# Patient Record
Sex: Male | Born: 1975 | Race: White | Hispanic: No | Marital: Single
Health system: Midwestern US, Community
[De-identification: ages and names within clinical notes are randomized; demographics above are authoritative.]

## PROBLEM LIST (undated history)

## (undated) DIAGNOSIS — M25531 Pain in right wrist: Secondary | ICD-10-CM

## (undated) DIAGNOSIS — S52501A Unspecified fracture of the lower end of right radius, initial encounter for closed fracture: Secondary | ICD-10-CM

---

## 2015-08-19 ENCOUNTER — Encounter: Attending: Internal Medicine | Primary: Registered Nurse

## 2015-08-24 ENCOUNTER — Encounter: Attending: Family | Primary: Registered Nurse

## 2015-11-18 ENCOUNTER — Emergency Department: Admit: 2015-11-18 | Payer: PRIVATE HEALTH INSURANCE | Primary: Registered Nurse

## 2015-11-18 ENCOUNTER — Inpatient Hospital Stay
Admit: 2015-11-18 | Discharge: 2015-11-18 | Disposition: A | Discharge status: Home / Self Care | Payer: PRIVATE HEALTH INSURANCE | Attending: Emergency Medicine

## 2015-11-18 DIAGNOSIS — S61214A Laceration without foreign body of right ring finger without damage to nail, initial encounter: Secondary | ICD-10-CM

## 2015-11-18 MED ORDER — TETANUS-DIPHTH-ACELL PERTUSSIS 5-2.5-18.5 LF-MCG/0.5 IM SUSP
5-2.5-18.5 LF-MCG/0.5 | Freq: Once | INTRAMUSCULAR | Status: AC
Start: 2015-11-18 — End: 2015-11-18
  Administered 2015-11-18: 19:00:00 0.5 mL via INTRAMUSCULAR

## 2015-11-18 MED ORDER — LIDOCAINE HCL 1 % IJ SOLN
1 % | Freq: Once | INTRAMUSCULAR | Status: AC
Start: 2015-11-18 — End: 2015-11-18
  Administered 2015-11-18: 20:00:00 5 mL via INTRADERMAL

## 2015-11-18 MED ORDER — IBUPROFEN 800 MG PO TABS
800 MG | ORAL_TABLET | Freq: Four times a day (QID) | ORAL | 0 refills | Status: DC | PRN
Start: 2015-11-18 — End: 2016-11-07

## 2015-11-18 MED ORDER — CEPHALEXIN 500 MG PO CAPS
500 MG | ORAL_CAPSULE | Freq: Four times a day (QID) | ORAL | 0 refills | Status: AC
Start: 2015-11-18 — End: 2015-11-28

## 2015-11-18 MED FILL — LIDOCAINE HCL 1 % IJ SOLN: 1 % | INTRAMUSCULAR | Qty: 20

## 2015-11-18 MED FILL — BOOSTRIX 5-2.5-18.5 IM SUSP: INTRAMUSCULAR | Qty: 0.5
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
## 2015-11-18 NOTE — ED Notes (Signed)
Report given to Dresser, Therapist, sports.      Jodene Nam, RN  11/18/15 [phone]

## 2015-11-18 NOTE — Care Coordination-Inpatient (Signed)
10/20 pt states he has an appt scheduled with PCP through his insurance, he is unsure of the name but assures me he has f/u.
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
## 2015-11-18 NOTE — Discharge Instructions (Signed)
Keep dressing clean, dry, and intact for 24 hours. After that you may take off the dressing and shower, pat dry wound and redress with clean gauze as instructed.   Look at laceration daily, if you notice any redness around the wound, increased swelling, pus draining or are having fevers, chills, nausea, or vomiting return to the Emergency Department.   Take pain and antibiotic medication as directed.   Do not smoke tobacco as this slows down bone healing and will prolong overall healing.   Follow up with the hand clinic.

## 2016-11-06 ENCOUNTER — Emergency Department: Admit: 2016-11-07 | Payer: PRIVATE HEALTH INSURANCE | Primary: Registered Nurse

## 2016-11-06 DIAGNOSIS — S52591A Other fractures of lower end of right radius, initial encounter for closed fracture: Secondary | ICD-10-CM
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
## 2016-11-06 NOTE — ED Notes (Signed)
Xray at bedside.     Army Fossa, RN  11/06/16 2300

## 2016-11-06 NOTE — ED Notes (Addendum)
Army Fossa, RN  11/07/16 [phone]

## 2016-11-06 NOTE — ED Notes (Signed)
Bed: 24  Expected date:   Expected time:   Means of arrival:   Comments:  [facility] [address] RN  11/06/16 2241
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
## 2016-11-07 ENCOUNTER — Inpatient Hospital Stay
Admit: 2016-11-07 | Discharge: 2016-11-07 | Disposition: A | Discharge status: Home / Self Care | Payer: PRIVATE HEALTH INSURANCE | Attending: Emergency Medicine

## 2016-11-07 ENCOUNTER — Emergency Department: Admit: 2016-11-07 | Payer: PRIVATE HEALTH INSURANCE | Primary: Registered Nurse

## 2016-11-07 MED ORDER — ACETAMINOPHEN 325 MG PO TABS
325 MG | ORAL_TABLET | Freq: Four times a day (QID) | ORAL | 0 refills | Status: DC | PRN
Start: 2016-11-07 — End: 2016-11-19

## 2016-11-07 MED ORDER — HYDROCODONE-ACETAMINOPHEN 5-325 MG PO TABS
5-325 MG | ORAL_TABLET | Freq: Four times a day (QID) | ORAL | 0 refills | Status: AC | PRN
Start: 2016-11-07 — End: 2016-11-14

## 2016-11-07 MED ORDER — PROPOFOL 200 MG/20ML IV EMUL
200 MG/20ML | INTRAVENOUS | Status: DC
Start: 2016-11-07 — End: 2016-11-07
  Administered 2016-11-07: 05:00:00 20 mg via INTRAVENOUS

## 2016-11-07 MED ORDER — IBUPROFEN 800 MG PO TABS
800 MG | ORAL_TABLET | Freq: Three times a day (TID) | ORAL | 0 refills | Status: DC | PRN
Start: 2016-11-07 — End: 2016-11-19

## 2016-11-07 MED ORDER — MORPHINE SULFATE (PF) 4 MG/ML IV SOLN
4 MG/ML | Freq: Once | INTRAVENOUS | Status: DC
Start: 2016-11-07 — End: 2016-11-07

## 2016-11-07 MED ORDER — MORPHINE SULFATE (PF) 4 MG/ML IV SOLN
4 MG/ML | Freq: Once | INTRAVENOUS | Status: AC
Start: 2016-11-07 — End: 2016-11-07
  Administered 2016-11-07: 05:00:00 4 mg via INTRAVENOUS

## 2016-11-07 MED ORDER — MIDAZOLAM HCL 2 MG/2ML IJ SOLN
2 MG/ML | Freq: Once | INTRAMUSCULAR | Status: AC
Start: 2016-11-07 — End: 2016-11-07
  Administered 2016-11-07: 05:00:00 1 mg via INTRAVENOUS

## 2016-11-07 MED ORDER — LIDOCAINE HCL (PF) 1 % IJ SOLN
1 % | Freq: Once | INTRAMUSCULAR | Status: AC
Start: 2016-11-07 — End: 2016-11-07
  Administered 2016-11-07: 04:00:00 20 mL via INTRADERMAL

## 2016-11-07 MED ORDER — PROPOFOL 200 MG/20ML IV EMUL
200 MG/20ML | INTRAVENOUS | Status: DC
Start: 2016-11-07 — End: 2016-11-07
  Administered 2016-11-07: 06:00:00 400 mg via INTRAVENOUS

## 2016-11-07 MED ORDER — ACETAMINOPHEN 500 MG PO TABS
500 MG | Freq: Once | ORAL | Status: AC
Start: 2016-11-07 — End: 2016-11-07
  Administered 2016-11-07: 06:00:00 1000 mg via ORAL

## 2016-11-07 MED ORDER — MORPHINE SULFATE (PF) 4 MG/ML IV SOLN
4 MG/ML | Freq: Once | INTRAVENOUS | Status: AC
Start: 2016-11-07 — End: 2016-11-06
  Administered 2016-11-07: 03:00:00 4 mg via INTRAVENOUS

## 2016-11-07 MED ORDER — IBUPROFEN 800 MG PO TABS
800 MG | Freq: Once | ORAL | Status: AC
Start: 2016-11-07 — End: 2016-11-07
  Administered 2016-11-07: 06:00:00 800 mg via ORAL

## 2016-11-07 MED FILL — MORPHINE SULFATE (PF) 4 MG/ML IV SOLN: 4 mg/mL | INTRAVENOUS | Qty: 1 | Fill #0

## 2016-11-07 NOTE — ED Notes (Signed)
Pt up and walking around in room. Pt sts he feels good and ready to go home.     Army Fossa, RN  11/07/16 [phone]

## 2016-11-07 NOTE — ED Notes (Signed)
Xray at bedside.     Army Fossa, RN  11/07/16 [phone]

## 2016-11-07 NOTE — ED Notes (Signed)
Xray at bedside again,.     Army Fossa, RN  11/07/16 [phone]

## 2016-11-07 NOTE — ED Notes (Signed)
Ortho resident at bedside.     Army Fossa, RN  11/07/16 [phone]

## 2016-11-07 NOTE — ED Notes (Signed)
Ortho placed pt in chinese finger trap to help with reduction. Pt tolerating well. Pt on full monitor.     Army Fossa, RN  11/07/16 [phone]
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
## 2016-11-07 NOTE — ED Notes (Signed)
Xray at bedside.     Army Fossa, RN  11/07/16 [phone]

## 2016-11-07 NOTE — ED Notes (Signed)
Ortho splinting right arm.     Army Fossa, RN  11/07/16 [phone]

## 2016-11-07 NOTE — ED Notes (Signed)
SW placed a call to provider to assist with transportation home upon D/C from ED        Denyse Dago, LSW  11/07/16 [phone]

## 2016-11-07 NOTE — ED Notes (Signed)
Pt c/o pain to right arm 9 out of 10. Dr. Alfonse Spruce made aware.     Army Fossa, RN  11/07/16 [phone]

## 2016-11-07 NOTE — ED Notes (Signed)
Pt axox4. Pt talking and eyes open. Significant other at bedside. Call light within reach.     Army Fossa, RN  11/07/16 [phone]

## 2016-11-07 NOTE — ED Notes (Signed)
Pt using urinal.     Army Fossa, RN  11/07/16 [phone]

## 2016-11-07 NOTE — Discharge Instructions (Addendum)
Orthopedic Instructions:  Weight bearing status: Non weight bearing for the right arm  Maintain splint at all times. Do not remove. Do NOT get wet. If splint falls off or becomes wet or soiled do not attempt to re-apply yourself. Come to the ED for new splint.  Always look for signs of compartment syndrome: pain out of proportion to the injury, pain not controlled with pain medication, numbness in digits, changing of color of digits (paleness). If these signs occur return to ED immediately for reassessment.   Ice (20 minutes on and off 1 hour) and elevate above the level of the heart to reduce swelling and throbbing pain.  Drink plenty of fluids.  Call the office or come to Emergency Room if signs of infection appear (hot, swollen, red, draining pus, fever)  Take medications as prescribed.  Wean off narcotics (percocet/norco) as soon as possible. Do not take tylenol if still taking narcotics.  Follow up with Dr. Posey Pronto in his office 7-10 days after injury. [phone] to schedule/confirm.

## 2016-11-10 ENCOUNTER — Inpatient Hospital Stay
Admit: 2016-11-10 | Discharge: 2016-11-10 | Disposition: A | Discharge status: Home / Self Care | Payer: PRIVATE HEALTH INSURANCE | Attending: Emergency Medicine

## 2016-11-10 DIAGNOSIS — M79601 Pain in right arm: Secondary | ICD-10-CM

## 2016-11-10 MED ORDER — OXYCODONE HCL 5 MG PO TABS
5 MG | ORAL_TABLET | Freq: Four times a day (QID) | ORAL | 0 refills | Status: AC | PRN
Start: 2016-11-10 — End: 2016-11-15

## 2016-11-10 MED ORDER — OXYCODONE HCL 5 MG PO TABS
5 MG | Freq: Once | ORAL | Status: AC
Start: 2016-11-10 — End: 2016-11-10
  Administered 2016-11-10: 20:00:00 10 mg via ORAL
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
## 2016-11-10 NOTE — ED Provider Notes (Signed)
Emergency Medicine Attending Note    I have seen and examined the patient in conjunction with the Resident/MLP.  Please see my key portion documented:      I agree with the assessment and plan as discussed with Dr. Tonye Royalty.    Electronically signed:  Roxanne Gates, M.D.           Burman Riis, MD  11/10/16 1622

## 2016-11-10 NOTE — Discharge Instructions (Signed)
Geronimo!!!    From [facility] Emergency Department    On behalf of the Emergency Department staff at [facility], I would like to thank you for giving [facility]. Evette Doffing the opportunity to address your health care needs and concerns.    We hope that during your visit, our service was delivered in a professional and caring manner. Please keep [facility]. Evette Doffing in mind as we walk with you down the path to your own personal wellness.     Please follow up with orthopedic surgery as planned.  Return to the emergency department for any worsening pain, numbness/tingling, pain with movement of her fingers, worsening swelling, or for any other cares or concerns.

## 2016-11-10 NOTE — ED Notes (Signed)
Joe Riis, Joe Campbell at bedside to evaluate pt     Merrily Pew, RN  11/10/16 [phone]
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
## 2016-11-13 ENCOUNTER — Encounter

## 2016-11-14 ENCOUNTER — Ambulatory Visit
Admit: 2016-11-14 | Discharge: 2016-11-14 | Payer: PRIVATE HEALTH INSURANCE | Attending: Orthopaedic Surgery | Primary: Registered Nurse

## 2016-11-14 ENCOUNTER — Ambulatory Visit: Admit: 2016-11-14 | Discharge: 2016-11-14 | Payer: PRIVATE HEALTH INSURANCE | Primary: Registered Nurse

## 2016-11-14 DIAGNOSIS — S52501A Unspecified fracture of the lower end of right radius, initial encounter for closed fracture: Secondary | ICD-10-CM

## 2016-11-14 DIAGNOSIS — M25531 Pain in right wrist: Secondary | ICD-10-CM

## 2016-11-14 MED ORDER — OXYCODONE HCL 5 MG PO TABS
5 MG | ORAL_TABLET | Freq: Four times a day (QID) | ORAL | 0 refills | Status: DC | PRN
Start: 2016-11-14 — End: 2016-11-19
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
## 2016-11-15 ENCOUNTER — Inpatient Hospital Stay
Admit: 2016-11-15 | Discharge: 2016-11-15 | Disposition: A | Discharge status: Home / Self Care | Payer: PRIVATE HEALTH INSURANCE | Attending: Emergency Medicine

## 2016-11-15 ENCOUNTER — Encounter: Attending: Orthopaedic Surgery | Primary: Registered Nurse

## 2016-11-15 DIAGNOSIS — S52354D Nondisplaced comminuted fracture of shaft of radius, right arm, subsequent encounter for closed fracture with routine healing: Secondary | ICD-10-CM

## 2016-11-15 MED ORDER — HYDROCODONE-ACETAMINOPHEN 5-325 MG PO TABS
5-325 MG | ORAL_TABLET | Freq: Four times a day (QID) | ORAL | 0 refills | Status: DC | PRN
Start: 2016-11-15 — End: 2016-11-19

## 2016-11-15 MED ORDER — HYDROCODONE-ACETAMINOPHEN 5-325 MG PO TABS
5-325 MG | Freq: Once | ORAL | Status: AC
Start: 2016-11-15 — End: 2016-11-15
  Administered 2016-11-15: 20:00:00 2 via ORAL
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
## 2016-11-15 NOTE — ED Provider Notes (Signed)
Emergency Medicine Attending Note    I have seen and examined the patient in conjunction with the Resident/MLP.  Please see my key portion documented:      I agree with the assessment and plan as discussed with Dr. Belva Crome.    Electronically signed:  Roxanne Gates, M.D.           Burman Riis, MD  11/15/16 [phone]
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
## 2016-11-15 NOTE — Telephone Encounter (Signed)
Patient called stating he lost his prescription after he filled it. Please advise patient if this can be refilled.

## 2016-11-15 NOTE — Telephone Encounter (Signed)
Contacted Patient.

## 2016-11-15 NOTE — Discharge Instructions (Signed)
Return to the emergency department if you notice any new or worsening symptoms.    Please see your primary care physician as soon as possible for further evaluation and treatment.    [place]!!!    From [facility] Emergency Department    On behalf of the Emergency Department staff at [facility], I would like to thank you for giving [facility]. Evette Doffing the opportunity to address your health care needs and concerns.    We hope that during your visit, our service was delivered in a professional and caring manner. Please keep [facility]. Evette Doffing in mind as we walk with you down the path to your own personal wellness.     Please expect an automated phone call from [phone] so we can ask a few questions about your health and progress. Based on your answers, a clinician may call you back to offer help and instructions.

## 2016-11-19 ENCOUNTER — Inpatient Hospital Stay
Admit: 2016-11-19 | Discharge: 2016-11-19 | Disposition: A | Discharge status: Home / Self Care | Payer: PRIVATE HEALTH INSURANCE | Attending: Emergency Medicine

## 2016-11-19 DIAGNOSIS — S42301D Unspecified fracture of shaft of humerus, right arm, subsequent encounter for fracture with routine healing: Secondary | ICD-10-CM

## 2016-11-19 MED ORDER — HYDROCODONE-ACETAMINOPHEN 5-325 MG PO TABS
5-325 MG | Freq: Once | ORAL | Status: AC
Start: 2016-11-19 — End: 2016-11-19
  Administered 2016-11-19: 20:00:00 2 via ORAL

## 2016-11-19 MED ORDER — IBUPROFEN 600 MG PO TABS
600 MG | ORAL_TABLET | Freq: Four times a day (QID) | ORAL | 0 refills | Status: DC | PRN
Start: 2016-11-19 — End: 2021-04-07

## 2016-11-19 MED ORDER — ACETAMINOPHEN 500 MG PO CAPS
500 MG | ORAL_CAPSULE | ORAL | 0 refills | Status: AC
Start: 2016-11-19 — End: ?

## 2016-11-19 NOTE — ED Provider Notes (Signed)
[place] ED     Emergency Department     Faculty Attestation        I performed a history and physical examination of the patient and discussed management with the resident. I reviewed the resident's note and agree with the documented findings and plan of care. Any areas of disagreement are noted on the chart. I was personally present for the key portions of any procedures. I have documented in the chart those procedures where I was not present during the key portions. I have reviewed the emergency nurses triage note. I agree with the chief complaint, past medical history, past surgical history, allergies, medications, social and family history as documented unless otherwise noted below.  For Physician Assistant/ Nurse Practitioner cases/documentation I have personally evaluated this patient and have completed at least one if not all key elements of the E/M (history, physical exam, and MDM). Additional findings are as noted.    Vital Signs: BP: 119/84  Pulse: 124  Resp: 18  Temp: 97.6 F (36.4 C) SpO2: 96 %  PCP:  No primary care provider on file.    Pertinent Comments:         Critical Care  None      (Please note that portions of this note were completed with a voice recognition program. Efforts were made to edit the dictations but occasionally words are mis-transcribed. Whenever words are used in this note in any gender, they shall be construed as though they were used in the gender appropriate to the circumstances; and whenever words are used in this note in the singular or plural form, they shall be construed as though they were used in the form appropriate to the circumstances.)    Melina Fiddler, MD Wilber Oliphant  Attending Emergency Medicine Physician              Precious Reel, MD  11/19/16 [phone]

## 2016-11-19 NOTE — Discharge Instructions (Signed)
Return to the emergency department if you notice any new or worsening symptoms.    Please see your primary care physician as soon as possible for further evaluation and treatment.    [place]!!!    From [facility] Emergency Department    On behalf of the Emergency Department staff at [facility], I would like to thank you for giving [facility]. Evette Doffing the opportunity to address your health care needs and concerns.    We hope that during your visit, our service was delivered in a professional and caring manner. Please keep [facility]. Evette Doffing in mind as we walk with you down the path to your own personal wellness.     Please expect an automated phone call from [phone] so we can ask a few questions about your health and progress. Based on your answers, a clinician may call you back to offer help and instructions.

## 2016-11-19 NOTE — ED Triage Notes (Signed)
Patient c/o right arm pain. Patient states that he knows its broke as seen Dr Sabra Heck. Patient states that this weekend he was arrested spent the weekend in jail without meds. Patient now states that he cant get his pain under control.

## 2016-11-19 NOTE — ED Provider Notes (Signed)
[facility] ED  Emergency Department Encounter  EmergencyMedicineResident     Pt Name:Joe Campbell  MRN: [id number]  [place] 1975-10-15  Date of evaluation: 11/19/16  PCP:  No primary care provider on file.    CHIEF COMPLAINT       Chief Complaint   Patient presents with   . Arm Pain       HISTORY OF PRESENT ILLNESS  (Location/Symptom, Timing/Onset, Context/Setting, Quality, Duration, Modifying Factors, Severity.)      Joe Campbell is a 41 y.o. male ( has no past medical history on file.) who presents with right arm pain, Old fracture that occurred on 11/06/16, is being followed by Dr. Sabra Heck (ortho). I saw this patient 4 days ago when he presented for the same issue. Patient has had no new injury or issues other than localized pain. He has kept his wrist splint on the entire time. Patient has not felt any new breaks or pops. He is able to move his fingers and the swelling has improved since his last visit to the ED. Patient has normal sensation and cap refill distally.    I prescribed the patient 6 pills of Norco for outpatient pain control, but advised him to attempt initial pain control with ibuprofen and acetaminophen.  Patient states that he picked up the prescription after discharge and has taken all but one of the pills.    Patient denies chest pain, shortness of breath, abdominal pain, nausea, vomiting, fever, chills, headache, dizziness, vision changes.     PAST MEDICAL / SURGICAL / SOCIAL / FAMILY HISTORY      has no past medical history on file.  No med hx     has a past surgical history that includes Leg Surgery (Right, 1992).  No surgical history      Social History     Social History   . Marital status: Single     Spouse name: N/A   . Number of children: N/A   . Years of education: N/A     Occupational History   . Not on file.     Social History Main Topics   . Smoking status: Current Every Day Smoker     Packs/day: 1.00     Types: Cigarettes   . Smokeless tobacco: Never Used   .  Alcohol use Yes   . Drug use: Yes     Types: Cocaine   . Sexual activity: Yes     Partners: Female     Other Topics Concern   . Not on file     Social History Narrative   . No narrative on file       History reviewed. No pertinent family history.      Allergies:  Patient has no known allergies.    Home Medications:  Prior to Admission medications    Medication Sig Start Date End Date Taking? Authorizing Provider   acetaminophen (TYLENOL) 325 MG tablet Take 2 tablets by mouth every 6 hours as needed for Pain 11/07/16  Yes Nat Math, MD       REVIEW OF SYSTEMS    (2-9 systems for level 4, 10 or more for level 5)      Review of Systems   Constitutional: Negative for activity change, chills, fatigue and fever.   HENT: Negative for congestion, ear pain, rhinorrhea and sinus pain.    Eyes: Negative for pain and redness.   Respiratory: Negative for apnea, cough, shortness of breath and wheezing.  Cardiovascular: Negative for chest pain.   Gastrointestinal: Negative for abdominal pain, blood in stool, diarrhea, nausea and vomiting.   Genitourinary: Negative for decreased urine volume, difficulty urinating, dysuria and hematuria.   Musculoskeletal: Negative for neck pain and neck stiffness.        Right wrist pain   Skin: Negative for rash.   Neurological: Negative for dizziness, syncope, weakness and headaches.       PHYSICAL EXAM   (up to 7 for level 4, 8 or more forlevel 5)      ED TRIAGE VITALS  BP: 119/84,Temp: 97.6 F (36.4 C), Pulse: 124, Resp: 18, SpO2: 96 %    Vitals:    11/19/16 1500   BP: 119/84   Pulse: 124   Resp: 18   Temp: 97.6 F (36.4 C)   SpO2: 96%   Weight: 160 lb (72.6 kg)   Height: 5' 8"  (1.727 m)       Physical Exam   Constitutional: He is oriented to person, place, and time. He appears well-developed and well-nourished. No distress.   HENT:   Head: Normocephalic and atraumatic.   Right Ear: External ear normal.   Left Ear: External ear normal.   Nose: Nose normal.   Mouth/Throat: Oropharynx  is clear and moist. No oropharyngeal exudate.   Eyes: Pupils are equal, round, and reactive to light. Conjunctivae and EOM are normal. Right eye exhibits no discharge. Left eye exhibits no discharge. No scleral icterus.   Neck: Normal range of motion. No tracheal deviation present.   Cardiovascular: Normal rate, regular rhythm, normal heart sounds and intact distal pulses.  Exam reveals no gallop and no friction rub.    No murmur heard.  Pulmonary/Chest: Effort normal and breath sounds normal. No stridor. No respiratory distress. He has no wheezes. He has no rales.   Abdominal: Soft. Bowel sounds are normal. He exhibits no distension and no mass. There is no tenderness.   Musculoskeletal:   Right wrist is in a splint, full range of motion of the elbow and fingers. Swelling has improved since his last visit. Full sensation and strength in the fingers.   Neurological: He is alert and oriented to person, place, and time. He exhibits normal muscle tone. Coordination normal.   Skin: Skin is warm and dry. No rash noted. He is not diaphoretic.         DIFFERENTIAL  DIAGNOSIS     PLAN (LABS / IMAGING / EKG):  No orders of the defined types were placed in this encounter.      MEDICATIONS ORDERED:  ED Medication Orders     Start Ordered     Status Ordering Provider    11/19/16 1615 11/19/16 1605  HYDROcodone-acetaminophen (NORCO) 5-325 MG per tablet 2 tablet  ONCE      Last MAR action:  Given - by Shanon Ace II on 11/19/16 at [place], [place]          DDX: Fracture, healing    DIAGNOSTIC RESULTS / EMERGENCY DEPARTMENT COURSE / MDM     IMPRESSION:  41 year old male with no relevant past medical history presents to the emergency department with pain in the right hand.  He has an old fracture that is still healing.  Initial injury was on 11/06/16.  Prescription of oxycodone given by Dr. Ammie Ferrier clinic with stool and/he lost it.  Patient did fill my prescription from 11/15/16, and is out of the [place].  Patient has been  wearing his splint as directed. He  has improvement in the swelling of his fingers. Full sensation, strength, and ROM of the fingers and elbow. Denies any new trauma. Plan is for pain control here in the ED only. No prescription for narcotics.    LABS:  No results found for this visit on 11/19/16.    RADIOLOGY:  No orders to display       EKG  None     All EKG's are interpreted by the Emergency Department Physician who either signsor Co-signs this chart in the absence of a cardiologist.    BEDSIDE ULTRASOUND:  None     EMERGENCY DEPARTMENT COURSE/MDM:  Patient was given 2 Norco here in the ED. He was told to follow up with Dr. Sabra Heck for further pain control. He was given appropriate ED return precautions and was also told to follow up with a PCP (clinic list provided). Patient was discharged in stable condition.     PROCEDURES:  None     CONSULTS:  None    CRITICAL CARE:  None     FINAL IMPRESSION      1. Right arm pain          DISPOSITION / PLAN     DISPOSITION    Discharge to home     PATIENT REFERRED TO:  [facility] ED  [place] [address]  [phone]  Go to   If symptoms worsen    Lowella Bandy, DO  [address]  [address] [place] [address]  [phone]      For follow-up of this visit      DISCHARGE MEDICATIONS:  New Prescriptions    ACETAMINOPHEN (ACETAMINOPHEN EXTRA STRENGTH) 500 MG CAPS    Take 1-2 tablets by mouth every 6 hours as needed for pain. Do not take more than 8 pills in a 24 hour period.    IBUPROFEN (ADVIL;MOTRIN) 600 MG TABLET    Take 1 tablet by mouth every 6 hours as needed for Pain       [name], MD  Emergency Medicine Resident    (Please note that portions of this note were completed witha voice recognition program.  Efforts were made to edit the dictations butoccasionally words are mis-transcribed.)       [name], MD  Resident  11/19/16 [phone]

## 2016-11-22 ENCOUNTER — Inpatient Hospital Stay: Admit: 2016-11-22 | Payer: PRIVATE HEALTH INSURANCE | Primary: Registered Nurse

## 2016-11-22 DIAGNOSIS — S52501A Unspecified fracture of the lower end of right radius, initial encounter for closed fracture: Secondary | ICD-10-CM

## 2016-11-22 NOTE — Telephone Encounter (Signed)
Patient came in office today for a refill on his pain medication. I asked Dr.Jacobsen who saw patient last Wednesday and he said that patient should be more comfortable being 2 weeks out from injury and in a brace. Dr.Jacbosen also checked with Dr.Patel. Dr.Patel said that since patient will be needing surgery he would like patient to alternate between ibuprofen and tylenol so that after surgery his pain can be better controlled.     Patient has a CT scan on 11/22/2016 to see what surgery he will have.    Called patient twice and his phone is not accepting any incoming calls at this time.

## 2016-11-28 NOTE — Telephone Encounter (Signed)
Tabor came in today asking about scheduling surgery following CT that was done on 11/22/16. He has an appt in clinic on 12/12/16 to go over results. He asked about pain meds for the interim.

## 2016-11-30 NOTE — Telephone Encounter (Signed)
I have been trying to reach this patient since Wednesday. There is not a good phone number to reach this patient. He is scheduled for surgery today. Please get a phone number if he calls back.

## 2016-11-30 NOTE — Telephone Encounter (Signed)
error 

## 2016-12-12 ENCOUNTER — Encounter: Primary: Registered Nurse

## 2016-12-12 NOTE — Telephone Encounter (Signed)
Lambros came in today asking about rescheduling surgery that he missed. He also asked about a refill on medication. # in chart is patients mother.

## 2016-12-13 NOTE — Telephone Encounter (Signed)
I thought I told you's morning that he is not to have surgery but to follow-up in the orthopedic clinic week after next.

## 2016-12-13 NOTE — Telephone Encounter (Signed)
Dr. Posey Pronto please advise as to course of action for this patient.

## 2016-12-18 NOTE — Telephone Encounter (Signed)
Left message for patient to call office.

## 2016-12-24 ENCOUNTER — Encounter

## 2016-12-26 ENCOUNTER — Encounter: Primary: Registered Nurse

## 2017-01-10 NOTE — Telephone Encounter (Signed)
Patient missed his appointment due to incarceration. Appointment scheduled January 2 at 2:40pm. Patient verbalizes understanding and is agreeable to appointment date and time.

## 2017-01-30 ENCOUNTER — Ambulatory Visit: Admit: 2017-01-30 | Discharge: 2017-01-30 | Primary: Registered Nurse

## 2017-01-30 ENCOUNTER — Ambulatory Visit
Admit: 2017-01-30 | Discharge: 2017-01-30 | Attending: Student in an Organized Health Care Education/Training Program | Primary: Registered Nurse

## 2017-01-30 DIAGNOSIS — S52501P Unspecified fracture of the lower end of right radius, subsequent encounter for closed fracture with malunion: Secondary | ICD-10-CM

## 2017-01-30 DIAGNOSIS — S52501A Unspecified fracture of the lower end of right radius, initial encounter for closed fracture: Secondary | ICD-10-CM

## 2017-01-30 MED ORDER — TRAMADOL HCL 50 MG PO TABS
50 MG | ORAL_TABLET | Freq: Three times a day (TID) | ORAL | 0 refills | Status: AC | PRN
Start: 2017-01-30 — End: 2017-02-06

## 2017-01-30 NOTE — Progress Notes (Signed)
[address]  [place] [address]  Dept: [phone]  Dept Fax: [phone]        Ambulatory Follow Up    Subjective:   Joe Campbell is a 41 y.o. year old male who presents to our office today for routine followup regarding his   1. 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 Post-Op Check     recheck wrist rt fx      HPI  Patient is a 42 year old male presents to the office follow-up after sustaining a intra-articular distal radius fracture back in early October. Patient was initially scheduled for surgery was unable to make his follow-up appointment and is procedure due to being in and out of jail. Patient states that his right wrist is painful, he has not been able to use it very much given the pain and stiffness. He states he occasionally wears the brace provided to them. He does state however that he was not able with brace on gel as it had metal in it. He states his been taking over-the-counter anti-inflammatories and Tylenol for pain relief but is not working. Patient states he is left-hand dominant.    Review of Systems    I have reviewed the CC, HPI, ROS, PMH, FHX, Social History.   I agree with the documentation provided by other staff, residents, and/or medical students and have reviewed their documentation prior to providing my signature indicating agreement.    Objective :   General: Joe Campbell is a 42 y.o. male who is alert and oriented and sitting comfortably in our office.  Ortho Exam  RUE: Positive ulnar variance appreciated. The radius is also noted to be in radial deviation as well as dorsally inhalation. Patient has limited range of motion to the wrist and fingers given pain. There is a 5 flexion contracture to the elbow. He is able to pronate proximal by 20, he lacks 20 of supination. Flexion of the fingers notes 2.5 cm distance from the  small finger to the palm, 3.5 cm distance from the ring finger to the palm, 3.75 cm distance from the middle finger to the palm and 4 cm distance from the index finger to the palm. Sensation intact grossly to the hand. Fingers warm and well-perfused.  Neuro: alert. oriented  Eyes: Extra-ocular muscles intact  Mouth: Oral mucosa moist. No perioral lesions  Pulm: Respirations unlabored and regular.  Skin: warm, well perfused  Psych:   Patient has good fund of knowledge and displays understanging of exam, diagnosis, and plan.    Radiology:   History:   Right intra-articular distal radius fracture     Comparison:   Radiograph some October 2018 for comparison    Findings:   3 views of the right wrist demonstrate distal radius intra-articular fracture with significant shortening dorsal angulation and ulnar positive variance    Impression:  Intra-articular distal radius fracture with    Assessment:      1. 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 Patient has a significantly deformed and malunited distal radius fracture. We discussed at this time that we will allow the fracture site to continue to consolidate. We encouraged the patient to begin physical therapy for range of motion to his fingers, wrist, and elbow. We  discussed that we will not perform corrective osteotomy surgery. Patient has full range of motion of his fingers.    2. Patient will perform occupational therapy here at [facility] and a prescription was provided for him. Patient was also provided with a prescription for tramadol 50 mg 3 times a day.    3. Patient will follow up in the office in 3 weeks for reevaluation. He is encouraged to call or return to the office sooner if questions or concerns. All questions were answered to his satisfaction, he is aware, stance, and voices his willingness to proceed as discussed.    Follow UX:LKGMWN in about 3 weeks (around 02/20/2017).    Orders Placed This  Encounter   Medications   . traMADol (ULTRAM) 50 MG tablet     Sig: Take 1 tablet by mouth every 8 hours as needed for Pain for up to 7 days..     Dispense:  21 tablet     Refill:  0       Orders Placed This Encounter   Procedures   . [place]     Referral Priority:   Routine     Referral Type:   Eval and Treat     Referral Reason:   Specialty Services Required     Requested Specialty:   Occupational Therapy     Number of Visits Requested:   1       Electronically signed by Lester Carolina, DO   Orthopedic Surgery Resident PGY-2  [facility]  [place], [place]  01/30/2017 at 3:53 PM

## 2017-04-16 NOTE — Significant Event (Signed)
[  x] [place].       Occupational Therapy       [address]       Phone: [phone]       Fax: [phone] []  [facility] [place]. [place], [place]       Phone: [phone]       Fax: [phone]          Occupational Therapy Cancel/No Show note    Date: 04/16/2017  Patient: Joe Campbell  DOB: 06/13/1975  MRN: [id number]  Cancels/No Shows to date: 0/1    For today's appointment patient:  []   Cancelled  []   Rescheduled appointment  [x]   No-show     Reason given by patient:  []   Patient ill  []   Conflicting appointment  []   No transportation    []   Conflict with work  []   No reason given  []   Other:     Comments:  No show for scheduled evaluation.    Electronically signed by: Dorrene German, OTR/L, CHT

## 2017-04-17 ENCOUNTER — Inpatient Hospital Stay: Primary: Registered Nurse

## 2021-02-22 ENCOUNTER — Encounter: Attending: Registered Nurse

## 2021-02-24 ENCOUNTER — Inpatient Hospital Stay
Admit: 2021-02-24 | Discharge: 2021-02-24 | Disposition: A | Discharge status: Home / Self Care | Payer: MEDICAID | Attending: Emergency Medicine

## 2021-02-24 ENCOUNTER — Emergency Department: Admit: 2021-02-24 | Payer: MEDICAID

## 2021-02-24 DIAGNOSIS — R000 Tachycardia, unspecified: Secondary | ICD-10-CM

## 2021-02-24 LAB — TROPONIN: Troponin, High Sensitivity: <6 ng/L (ref 0–22)

## 2021-02-24 LAB — CBC
Hematocrit: 44.2 % (ref 40.7–50.3)
Hemoglobin: 14.9 g/dL (ref 13.0–17.0)
MCH: 30.3 pg (ref 25.2–33.5)
MCHC: 33.7 g/dL (ref 28.4–34.8)
MCV: 89.8 fL (ref 82.6–102.9)
MPV: 11.1 fL (ref 8.1–13.5)
NRBC Automated: 0 per 100 WBC
Platelets: 200 10*3/uL (ref 138–453)
RBC: 4.92 m/uL (ref 4.21–5.77)
RDW: 13.4 % (ref 11.8–14.4)
WBC: 9.1 10*3/uL (ref 3.5–11.3)

## 2021-02-24 LAB — COMPREHENSIVE METABOLIC PANEL
ALT: 10 U/L (ref 5–41)
AST: 18 U/L (ref <40)
Albumin/Globulin Ratio: 1.6 (ref 1.0–2.5)
Albumin: 4.2 g/dL (ref 3.5–5.2)
Alkaline Phosphatase: 92 U/L (ref 40–129)
Anion Gap: 8 mmol/L — ABNORMAL LOW (ref 9–17)
BUN: 9 mg/dL (ref 6–20)
CO2: 25 mmol/L (ref 20–31)
Calcium: 8.8 mg/dL (ref 8.6–10.4)
Chloride: 104 mmol/L (ref 98–107)
Creatinine: 0.9 mg/dL (ref 0.70–1.20)
Est, Glom Filt Rate: >60 mL/min/{1.73_m2} (ref >60)
Glucose: 105 mg/dL — ABNORMAL HIGH (ref 70–99)
Potassium: 4 mmol/L (ref 3.7–5.3)
Sodium: 137 mmol/L (ref 135–144)
Total Bilirubin: 0.3 mg/dL (ref 0.3–1.2)
Total Protein: 6.9 g/dL (ref 6.4–8.3)

## 2021-02-24 LAB — TSH WITH REFLEX: TSH: 0.53 u[IU]/mL (ref 0.30–5.00)

## 2021-02-24 LAB — COVID-19, RAPID: SARS-CoV-2, Rapid: NOT DETECTED

## 2021-02-24 LAB — MAGNESIUM: Magnesium: 2.1 mg/dL (ref 1.6–2.6)

## 2021-02-24 MED ORDER — ASPIRIN 81 MG PO CHEW
81 MG | Freq: Once | ORAL | Status: AC
Start: 2021-02-24 — End: 2021-02-24
  Administered 2021-02-24: 17:00:00 324 mg via ORAL

## 2021-02-24 MED ORDER — SODIUM CHLORIDE 0.9 % IV BOLUS
0.9 % | Freq: Once | INTRAVENOUS | Status: AC
Start: 2021-02-24 — End: 2021-02-24
  Administered 2021-02-24: 17:00:00 1000 mL via INTRAVENOUS

## 2021-02-24 MED FILL — ASPIRIN LOW STRENGTH 81 MG PO CHEW: 81 mg | ORAL | Qty: 4

## 2021-02-24 NOTE — ED Provider Notes (Signed)
[facility] ED  Emergency Department Encounter  Emergency Medicine Resident     Pt Name:Joe Campbell  MRN: [id number]  [place] 1975/02/12  Date of evaluation: 02/24/21  PCP:  No primary care provider on file.      CHIEF COMPLAINT       Chief Complaint   Patient presents with    Fever    Back Pain    Chest Pain       HISTORY OF PRESENT ILLNESS  (Location/Symptom, Timing/Onset, Context/Setting, Quality, Duration, Modifying Factors, Severity.)      Joe Campbell is a 46 y.o. male who presents with chest pain back pain and fever.  He was kicked out of the plasma center today as he was told that he had a fever.  He states he was kicked out of the plasma center 2 days ago as his heart rate was elevated.  He denies any illness but is suspicious may be he has COVID.  He has a history of smoking otherwise has no diagnosed past medical history and does not follow with a primary care physician.  He states he had progressive back pain has been going on now for 3 months getting worse.  He denies any numbness, tingling, weakness or loss of bowel or bladder control.  Denies any IV drug use, diabetes or dialysis.    PAST MEDICAL / SURGICAL / SOCIAL / FAMILY HISTORY      has no past medical history on file.       has a past surgical history that includes Leg Surgery (Right, 1992).      Social History     Socioeconomic History    Marital status: Single     Spouse name: Not on file    Number of children: Not on file    Years of education: Not on file    Highest education level: Not on file   Occupational History    Not on file   Tobacco Use    Smoking status: Every Day     Packs/day: 1.00     Types: Cigarettes    Smokeless tobacco: Never   Substance and Sexual Activity    Alcohol use: Yes    Drug use: Yes     Types: Cocaine    Sexual activity: Yes     Partners: Female   Other Topics Concern    Not on file   Social History Narrative    Not on file     Social Determinants of Health     Financial Resource Strain:  Not on file   Food Insecurity: Not on file   Transportation Needs: Not on file   Physical Activity: Not on file   Stress: Not on file   Social Connections: Not on file   Intimate Partner Violence: Not on file   Housing Stability: Not on file       No family history on file.    Allergies:  Patient has no known allergies.    Home Medications:  Prior to Admission medications    Medication Sig Start Date End Date Taking? Authorizing Provider   RA ACETAMINOPHEN EX ST 500 MG tablet take 1 to 2 tablets by mouth every 6 hours if needed for pain maximum daily dose of 8 12/11/16   Historical Provider, MD   Acetaminophen (ACETAMINOPHEN EXTRA STRENGTH) 500 MG CAPS Take 1-2 tablets by mouth every 6 hours as needed for pain. Do not take more than 8 pills in a 24 hour  period. 11/19/16   [name], MD   ibuprofen (ADVIL;MOTRIN) 600 MG tablet Take 1 tablet by mouth every 6 hours as needed for Pain 11/19/16   [name], MD         REVIEW OF SYSTEMS       Review of Systems   Constitutional:  Negative for chills.   Cardiovascular:  Positive for chest pain and palpitations.   Gastrointestinal:  Negative for abdominal pain, nausea and vomiting.   Musculoskeletal:  Negative for back pain.   Skin:  Negative for rash and wound.     PHYSICAL EXAM      INITIAL VITALS:   BP 115/86    Pulse 99    Temp 98.7 ??F (37.1 ??C)    Resp 12    SpO2 100%     Physical Exam  Constitutional:       General: He is not in acute distress.     Appearance: Normal appearance. He is not ill-appearing.   HENT:      Head: Normocephalic and atraumatic.      Nose: Nose normal.      Mouth/Throat:      Mouth: Mucous membranes are dry.   Cardiovascular:      Rate and Rhythm: Regular rhythm. Tachycardia present.   Pulmonary:      Effort: Pulmonary effort is normal. No respiratory distress.      Breath sounds: No wheezing or rales.   Abdominal:      General: There is no distension.   Musculoskeletal:         General: Normal range of motion.      Cervical back: Normal  range of motion.      Right lower leg: No edema.      Left lower leg: No edema.   Skin:     General: Skin is warm.      Capillary Refill: Capillary refill takes less than 2 seconds.   Neurological:      General: No focal deficit present.      Mental Status: He is alert and oriented to person, place, and time.         DDX/DIAGNOSTIC RESULTS / EMERGENCY DEPARTMENT COURSE / MDM     Medical Decision Making  46 year old male coming in today for evaluation of palpitation, chest pain, back pain.  He was asked to leave the day at the plasma center today as he had a fever.  He was afebrile here.  He was kicked out of the plasma center 2 days ago as his heart rate was too fast.  His heart rate is 110 here.  EKG nondiagnostic showing sinus tachycardia.  His chest pain complaint is one that feels "numbness" over top of the anterior chest wall.  He does have a history of smoking.  Otherwise no significant past medical history.  His exam was noted for clear breath sounds normal heart rate.  No leg swelling.  No acute distress on exam.  Chest x-ray negative.  ACS work-up negative.  We did add on electrolytes and TSH were both within normal limits.  Provided an aspirin for pain control.  He felt better by the end of the visit.  He will be discharged home after a liter of IV fluids and improvement in his heart rate to follow-up with his primary care physician.  I provided him with home stretching exercises and instructions to help manage back pain at home.    Amount and/or Complexity of Data Reviewed  Labs:  ordered. Decision-making details documented in ED Course.  Radiology: ordered and independent interpretation performed.     Details: No pneumonia or pneumothorax  ECG/medicine tests: ordered.    Risk  OTC drugs.  Prescription drug management.    Critical Care  Total time providing critical care: < 30 minutes        EMERGENCY DEPARTMENT COURSE:    ED Course as of 02/24/21 1547   Fri Feb 24, 2021   1126 Temp: 98.7 ??F (37.1 ??C) [TJ]    1126 Heart Rate(!): 110 [TJ]   1241 Troponin, High Sensitivity: <6 [TJ]   1241 SARS-CoV-2, Rapid: Not Detected [TJ]   1251 CXR negative [TJ]   1428 TSH: 0.53 [TJ]      ED Course User Index  [TJ] Weston Brass, MD       PROCEDURES:    Procedures    CONSULTS:  None        FINAL IMPRESSION      1. Tachycardia          DISPOSITION / PLAN     DISPOSITION Decision To Discharge 02/24/2021 02:28:33 PM      PATIENT REFERRED TO:  [place] [id number]  [place]:  Discharge Medication List as of 02/24/2021  2:30 PM          Weston Brass, MD  Emergency Medicine Resident    (Please note that portions of thisnote were completed with a voice recognition program.  Efforts were made to edit the dictations but occasionally words are mis-transcribed.)       Weston Brass, MD  Resident  02/24/21 [phone]

## 2021-02-24 NOTE — ED Notes (Signed)
Pt walks to the bathroom on steady gate     Sherry Ruffing, RN  02/24/21 1345

## 2021-02-24 NOTE — ED Notes (Signed)
Pt came c/o Back pain, and fever. Pt said he is about to donate blood, but was held cause he had a fever when they took his vitals and he Tachycardic too, thus was sent to ED. Pt conscious and coherent upon admission, afebrile as well. Had an on and off back pain for about a yaer.     Sherry Ruffing, RN  02/24/21 1125

## 2021-02-24 NOTE — ED Notes (Signed)
Pt back from [place], [place]  02/24/21 1237

## 2021-02-24 NOTE — ED Notes (Signed)
Pt to xray on [place], RN  02/24/21 [phone]

## 2021-02-24 NOTE — Discharge Instructions (Addendum)
Call today or tomorrow to follow up with your PCP.      Use an ice pack or bag filled with ice and apply to the injured area 3 - 4 times a day for 15 - 20 minutes each time.  If the injury is older than 3 days, then use a heating pad to help relax the muscles in your back.    Use ibuprofen or Tylenol (unless prescribed medications that have Tylenol in it) for pain.  You can take over the counter Ibuprofen (advil) tablets (4 tablets every 8 hours or 3 tablets every 6 hours or 2 tablets every 4 hours)    Williams' Flexion Exercises     1. Pelvic tilt. Lie on your back with knees bent, feet flat on floor. Flatten the small of your back against the floor, without pushing down with the legs. Hold for 5 to 10 seconds.     2. Single Knee to chest. Lie on your back with knees bent and feet flat on the floor. Slowly pull your right knee toward your shoulder and hold 5 to 10 seconds. Lower the knee and repeat with the other knee.     3. Double knee to chest. Begin as in the previous exercise. After pulling right knee to chest, pull left knee to chest and hold both knees for 5 to 10 seconds. Slowly lower one leg at a time.     4. Partial sit-up. Do the pelvic tilt (exercise 1) and, while holding this position, slowly curl your head and shoulders off the floor. Hold briefly. Return slowly to the starting position.     5. Hamstring stretch. Start in long sitting with toes directed toward the ceiling and knees fully extended. Slowly lower the trunk forward over the legs, keeping knees extended, arms outstretched over the legs, and eyes focus ahead.     6. Hip Flexor stretch. Place one foot in front of the other with the left (front) knee flexed and the right (back) knee held rigidly straight. Flex forward through the trunk until the left knee contacts the axillary fold (arm pit region). Repeat with right leg forward and left leg back.     7. Squat. Stand with both feet parallel, about shoulder's width apart. Attempting to  maintain the trunk as perpendicular as possible to the floor, eyes focused ahead, and feet flat on the floor, the subject slowly lowers his body by flexing his knees    Return to the Emergency Department for inability to move legs, worsening of pain, tingling / loss of sensation, any other care or concern.

## 2021-02-24 NOTE — ED Notes (Signed)
Pt back to room     Sherry Ruffing, RN  02/24/21 1401

## 2021-02-24 NOTE — ED Provider Notes (Signed)
[facility]     Emergency Department     Faculty Attestation    I performed a history and physical examination of the patient and discussed management with the resident. I reviewed the resident???s note and agree with the documented findings including all diagnostic interpretations and plan of care. Any areas of disagreement are noted on the chart. I was personally present for the key portions of any procedures. I have documented in the chart those procedures where I was not present during the key portions. I have reviewed the emergency nurses triage note. I agree with the chief complaint, past medical history, past surgical history, allergies, medications, social and family history as documented unless otherwise noted below. Documentation of the HPI, Physical Exam and Medical Decision Making performed by scribes is based on my personal performance of the HPI, PE and MDM. For Physician Assistant/ Nurse Practitioner cases/documentation I have personally evaluated this patient and have completed at least one if not all key elements of the E/M (history, physical exam, and MDM). Additional findings are as noted.    Primary Care Physician: No primary care provider on file.    VITAL SIGNS:   temperature is 98.7 ??F (37.1 ??C). His blood pressure is 115/86 and his pulse is 99. His respiration is 12 and oxygen saturation is 100%.      Medical Decision Making  Chest pain back pain fever and tachycardia.  Has had multiple episodes where he cannot donate plasma due to use of fever tachycardia.  Does report cough with this.  No vomiting.  No IV use other than plasma donation including IV drug use.  Denies any numbness weakness or urinary complaints.  On exam cardiac exam regular rate and rhythm no murmurs rubs gallops, pulmonary clear bilaterally abdomen soft nontender nondistended there is left paraspinal spasm on back exam no midline tenderness no overlying skin  changes.    Amount and/or Complexity of Data Reviewed  Labs: ordered. Decision-making details documented in ED Course.  Radiology: ordered.  ECG/medicine tests: ordered.    Risk  OTC drugs.  Prescription drug management.      EKG Interpretation    Interpreted by emergency department physician    Rhythm:  Borderline tachycardia  Rate: 100  Axis: normal  Ectopy: none  Conduction: normal  ST Segments: normal  T Waves: normal  Q Waves: none    EKG  Impression: Borderline tachycardia, otherwise normal EKG    Ree Shay, MD      Harley Hallmark, MD, Wilber Oliphant  Attending Emergency Physician        Ree Shay, MD  02/24/21 1315

## 2021-02-25 LAB — EKG 12-LEAD
Atrial Rate: 100 {beats}/min
P Axis: 66 degrees
P-R Interval: 146 ms
Q-T Interval: 326 ms
QRS Duration: 82 ms
QTc Calculation (Bazett): 420 ms
R Axis: 26 degrees
T Axis: 59 degrees
Ventricular Rate: 100 {beats}/min

## 2021-03-09 ENCOUNTER — Inpatient Hospital Stay: Payer: MEDICAID | Primary: Registered Nurse

## 2021-03-09 ENCOUNTER — Ambulatory Visit: Admit: 2021-03-09 | Discharge: 2021-03-09 | Payer: MEDICAID | Attending: Registered Nurse | Primary: Registered Nurse

## 2021-03-09 DIAGNOSIS — R7309 Other abnormal glucose: Secondary | ICD-10-CM

## 2021-03-09 DIAGNOSIS — Z7689 Persons encountering health services in other specified circumstances: Secondary | ICD-10-CM

## 2021-03-09 LAB — LIPID PANEL
Chol/HDL Ratio: 3.5 (ref <5)
Cholesterol: 150 mg/dL (ref <200)
HDL: 43 mg/dL (ref >40)
LDL Cholesterol: 92 mg/dL (ref 0–130)
Triglycerides: 76 mg/dL (ref <150)

## 2021-03-09 MED ORDER — METHYLPREDNISOLONE 4 MG PO TBPK
4 MG | PACK | ORAL | 0 refills | Status: AC
Start: 2021-03-09 — End: 2021-03-15

## 2021-03-09 MED ORDER — LIDOCAINE 5 % EX PTCH
5 % | MEDICATED_PATCH | Freq: Every day | CUTANEOUS | 0 refills | Status: AC
Start: 2021-03-09 — End: 2021-03-19

## 2021-03-09 MED ORDER — KETOCONAZOLE 2 % EX CREA
2 % | Freq: Two times a day (BID) | CUTANEOUS | 3 refills | Status: AC
Start: 2021-03-09 — End: ?

## 2021-03-09 MED ORDER — MELOXICAM 15 MG PO TABS
15 MG | ORAL_TABLET | Freq: Every day | ORAL | 3 refills | Status: AC
Start: 2021-03-09 — End: ?

## 2021-03-09 NOTE — Progress Notes (Signed)
Joe Campbell (DOB:  Jun 14, 1975) is a 46 y.o. male,New patient, here for evaluation of the following chief complaint(s):  New Patient (Has not been seen in about 10 years. /States that he donates plasma and his pulse is in the 120s doing nothing. Pt went to the ED and and EKG weas completed and it was normal. /), Health Maintenance (Depression screen completed. /), Back Pain (Pt states hurts to stand and sit for long period of time. States been on going since 2018. Pt denies having images of his back. ), and Rash (Left side "rings" he has had them for about a week. /)         ASSESSMENT/PLAN:  1. Encounter to establish care  Comments:  Rx for remainder of annual labs - follow up pending results.   2. Elevated glucose  -     Hemoglobin A1C; Future  3. Encounter for screening for HIV  -     HIV Screen; Future  4. Hyperlipidemia, unspecified hyperlipidemia type  -     Lipid Panel; Future  5. Chest pain, unspecified type  Comments:  Rx for2d echo, stress test. Red flag s/s of when to seek emergent care for chest pain reviewed. Verbalized understanding.  Orders:  -     ECHO 2D WO Color Doppler Complete; Future  -     CARDIAC STRESS TEST EXERCISE ONLY; Future  6. Tachycardia  Comments:  Advise smoking cessation. 2d echo, stress test ordered today. Heart rate WNL today. Holter monitor if persists.  7. Ringworm  -     ketoconazole (NIZORAL) 2 % cream; Apply 1 Applicatorful topically 2 times daily Apply topically BID for 2-4 weeks, Topical, 2 TIMES DAILY Starting Thu 03/09/2021, Disp-60 g, R-3, Normal  8. Encounter for hepatitis C virus screening test for high risk patient  -     Hepatitis C Antibody; Future  9. Nicotine abuse  Comments:  Not ready to quit smoking.  10. Chronic midline low back pain without sciatica  -     XR LUMBAR SPINE (2-3 VIEWS); Future  -     meloxicam (MOBIC) 15 MG tablet; Take 1 tablet by mouth daily, Disp-30 tablet, R-3Normal  -     methylPREDNISolone (MEDROL, PAK,) 4 MG tablet; Take as  directed, Disp-1 kit, R-0Normal  -     lidocaine (LIDODERM) 5 %; Place 1 patch onto the skin daily for 10 days 12 hours on, 12 hours off., Disp-10 patch, R-0Normal  NSAID/Tyelnol abuse - discussed appropriate use of medication  Rx for mobic - do not take with additional NSAIDS - verbalized understanding  Rx for medrol pak and lidocaine patch with instruction for use  Rx for XR lumbar spine, follow up pending results       There are other unrelated non-urgent complaints, but due to the busy schedule and the amount of time I've already spent with him, time does not permit me to address these routine issues at today's visit. I've requested another appointment to review these additional issues.      Return in about 4 weeks (around 04/06/2021) for 1 month follow up from establishing care .         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 Has trouble sitting for extended periods of time.   Manages pain with OTC tylenol/motrin. Admits he over uses tylenol/motrin, takes anywhere from 4-6 pills every 4 to 6 hours daily.     Derm: Has several ring like rashes to abdomen   They are not itchy, 'just keep showing up'     Reports hx of drug and alcohol abuse  States last time he smoked crack  was 2019  Admits to occasional marijuana use  No longer drinks alcohol       Back Pain  This is a chronic problem. The current episode started more than 1 year ago (2018). The problem occurs constantly. The problem has been gradually worsening since onset. The pain is present in the lumbar spine. The quality of the pain is described as aching and stabbing (stabbing when he stands or sits). The pain does not radiate. The pain is at a severity of 8/10. The pain is severe. The pain is The same all the time. The symptoms are aggravated by twisting, standing and sitting. Stiffness is present In the morning. Pertinent negatives include no abdominal pain, bladder incontinence, bowel incontinence, chest pain, dysuria, headaches, leg pain, numbness, paresthesias, tingling or weakness. Risk factors include poor posture and sedentary lifestyle. He has tried NSAIDs for the symptoms. The treatment provided no relief.     Review of Systems   Constitutional:  Negative for activity change, chills, fatigue and unexpected weight change.   HENT:  Negative for hearing loss, postnasal drip, sinus pressure and sinus pain.    Eyes:  Negative for pain and visual disturbance.   Respiratory:  Negative for chest tightness and shortness of breath.    Cardiovascular:  Positive for palpitations. Negative for chest pain.   Gastrointestinal:  Negative for abdominal pain, bowel incontinence, constipation, diarrhea, nausea and vomiting.   Endocrine: Negative for polydipsia, polyphagia and polyuria.   Genitourinary:  Negative for bladder incontinence, dysuria, flank pain, frequency and urgency.   Musculoskeletal:  Positive for back pain and gait problem. Negative for arthralgias and myalgias.   Skin:  Positive for rash. Negative for color change.   Neurological:  Negative for dizziness, tingling, weakness, light-headedness, numbness, headaches and paresthesias.   Psychiatric/Behavioral:  Negative for confusion, hallucinations, self-injury, sleep  disturbance and suicidal ideas. The patient is not nervous/anxious.         Objective   Physical Exam  Vitals and nursing note reviewed.   Constitutional:       Appearance: Normal appearance.   HENT:      Head: Normocephalic.      Right Ear: Hearing normal.      Left Ear: Hearing normal.      Nose: Nose normal.      Mouth/Throat:      Mouth: Mucous membranes are moist.      Pharynx: Oropharynx is clear.   Eyes:      Extraocular Movements: Extraocular  movements intact.      Conjunctiva/sclera: Conjunctivae normal.      Pupils: Pupils are equal, round, and reactive to light.   Cardiovascular:      Rate and Rhythm: Normal rate and regular rhythm.      Pulses: Normal pulses.      Heart sounds: Normal heart sounds.   Pulmonary:      Effort: Pulmonary effort is normal.      Breath sounds: Normal breath sounds.   Abdominal:      General: Abdomen is flat. Bowel sounds are normal.      Palpations: Abdomen is soft.   Musculoskeletal:      Cervical back: Normal range of motion.      Lumbar back: Tenderness present. No swelling, spasms or bony tenderness. Decreased range of motion. Negative right straight leg raise test and negative left straight leg raise test.   Skin:     General: Skin is warm and dry.      Capillary Refill: Capillary refill takes less than 2 seconds.      Findings: Rash present.             Comments: Several ringed scaling, macular reddened lesions with central clearing noted to abdomen. Reports as sometimes pruritic.    Neurological:      General: No focal deficit present.      Mental Status: He is alert and oriented to person, place, and time. Mental status is at baseline.   Psychiatric:         Mood and Affect: Mood normal.         Behavior: Behavior normal.         Thought Content: Thought content normal.         Judgment: Judgment normal.              Wt Readings from Last 3 Encounters:   03/09/21 178 lb 3.2 oz (80.8 kg)   01/30/17 160 lb 0.9 oz (72.6 kg)   11/19/16 160 lb (72.6 kg)     Temp Readings  from Last 3 Encounters:   02/24/21 98.7 ??F (37.1 ??C)   11/19/16 97.6 ??F (36.4 ??C)   11/15/16 98.1 ??F (36.7 ??C) (Oral)     BP Readings from Last 3 Encounters:   03/09/21 118/82   02/24/21 115/86   11/19/16 119/84     Pulse Readings from Last 3 Encounters:   03/09/21 93   02/24/21 99   11/19/16 124         An electronic signature was used to authenticate this note.    --[name], APRN - NP

## 2021-03-10 LAB — HEPATITIS C ANTIBODY: Hepatitis C Ab: NONREACTIVE

## 2021-03-10 LAB — HIV SCREEN: HIV Ag/Ab: NONREACTIVE

## 2021-03-10 LAB — HEMOGLOBIN A1C
Estimated Avg Glucose: 105 mg/dL
Hemoglobin A1C: 5.3 % (ref 4.0–6.0)

## 2021-03-20 ENCOUNTER — Inpatient Hospital Stay
Admit: 2021-03-20 | Discharge: 2021-03-20 | Discharge status: Against Medical Advice | Payer: MEDICAID | Attending: Emergency Medicine

## 2021-03-20 ENCOUNTER — Emergency Department: Admit: 2021-03-20 | Payer: MEDICAID | Primary: Registered Nurse

## 2021-03-20 ENCOUNTER — Inpatient Hospital Stay: Admit: 2021-03-20 | Payer: MEDICAID | Primary: Registered Nurse

## 2021-03-20 DIAGNOSIS — M545 Low back pain, unspecified: Secondary | ICD-10-CM

## 2021-03-20 DIAGNOSIS — R079 Chest pain, unspecified: Secondary | ICD-10-CM

## 2021-03-20 NOTE — ED Notes (Signed)
Pt presented to ED via triage for the complaint of chronic lower back pain. Pt denies new injury. Pt states he cut out caffeine and lower back pain has resolved. Pt ambulated with steady gait     Letitia Neri, RN  03/20/21 1430

## 2021-03-20 NOTE — ED Provider Notes (Signed)
[facility]     Emergency Department     Faculty Attestation        I performed a history and physical examination of the patient and discussed management with the resident. I reviewed the resident???s note and agree with the documented findings and plan of care. Any areas of disagreement are noted on the chart. I was personally present for the key portions of any procedures. I have documented in the chart those procedures where I was not present during the key portions. I have reviewed the emergency nurses triage note. I agree with the chief complaint, past medical history, past surgical history, allergies, medications, social and family history as documented unless otherwise noted below.    For mid-level providers such as nurse practitioners as well as physicians assistants:    I have personally seen and evaluated the patient.    I find the patient's history and physical exam are consistent with NP/PA documentation.  I agree with the care provided, treatment rendered, disposition, & follow-up plan.     Additional findings are as noted.    Vital Signs: BP 118/75    Pulse 87    Temp 97.2 ??F (36.2 ??C) (Oral)    Resp 16    SpO2 98%   PCP:  [name], APRN - NP    Pertinent Comments:     Acute on chronic low back pain.  No falls or trauma feeling better with therapy provided by PCP.  No bowel or bladder incontinence history of IV drug abuse.  On exam is afebrile nontoxic with some mild lumbar back tenderness no step-offs deformities able walk and ambulate with no assistance able to walk on heels and toes.      Critical Care  None          Westly Pam, MD    Attending Emergency Medicine Physician            Festus Aloe, MD  03/20/21 1257

## 2021-03-20 NOTE — ED Provider Notes (Signed)
[facility] ED  Emergency Department Encounter  Emergency Medicine Resident     Pt Name:Joe Campbell  MRN: [id number]  [place] December 21, 1975  Date of evaluation: 03/20/21  PCP:  [name], APRN - NP  Note Started: 12:15 PM EST      CHIEF COMPLAINT       Chief Complaint   Patient presents with    Back Pain     Pts PCP sent him in for xray       HISTORY OF PRESENT ILLNESS  (Location/Symptom, Timing/Onset, Context/Setting, Quality, Duration, Modifying Factors, Severity.)      Joe EGGEBRECHT is a 46 y.o. male who presents with low back pain intermittently for the last 5 years.  Patient states his pain has been significantly improved over the last several days since he has been drinking more water.  He denies any bowel or bladder involvement.  States his back pain for started many years ago after lifting a car part.  He has been following with his PCP for some time for the symptoms and was prescribed meloxicam and recommended to follow-up for x-rays.  He has had no changes in his symptoms since last seen by his PCP and is here for imaging.    PAST MEDICAL / SURGICAL / SOCIAL / FAMILY HISTORY      has no past medical history on file.       has a past surgical history that includes Leg Surgery (Right, 1992).      Social History     Socioeconomic History    Marital status: Single     Spouse name: Not on file    Number of children: Not on file    Years of education: Not on file    Highest education level: Not on file   Occupational History    Not on file   Tobacco Use    Smoking status: Every Day     Packs/day: 1.00     Years: 30.00     Pack years: 30.00     Types: Cigarettes    Smokeless tobacco: Never   Substance and Sexual Activity    Alcohol use: Not Currently    Drug use: Yes     Types: Cocaine, Marijuana (Weed)     Comment: states marijuanna here and there.    Sexual activity: Yes     Partners: Female   Other Topics Concern    Not on file   Social History Narrative    Not on file     Social  Determinants of Health     Financial Resource Strain: High Risk    Difficulty of Paying Living Expenses: Hard   Food Insecurity: Food Insecurity Present    Worried About Charity fundraiser in the Last Year: Often true    Arboriculturist in the Last Year: Often true   Transportation Needs: Event organiser (Medical): Not on file    Lack of Transportation (Non-Medical): Yes   Physical Activity: Not on file   Stress: Not on file   Social Connections: Not on file   Intimate Partner Violence: Not on file   Housing Stability: High Risk    Unable to Pay for Housing in the Last Year: Not on file    Number of Places Lived in the Last Year: Not on file    Unstable Housing in the Last Year: Yes       History  reviewed. No pertinent family history.    Allergies:  Patient has no known allergies.    Home Medications:  Prior to Admission medications    Medication Sig Start Date End Date Taking? Authorizing Provider   meloxicam (MOBIC) 15 MG tablet Take 1 tablet by mouth daily 03/09/21   [name], APRN - NP   ketoconazole (NIZORAL) 2 % cream Apply 1 Applicatorful topically 2 times daily Apply topically BID for 2-4 weeks 03/09/21   [name], APRN - NP   Acetaminophen (ACETAMINOPHEN EXTRA STRENGTH) 500 MG CAPS Take 1-2 tablets by mouth every 6 hours as needed for pain. Do not take more than 8 pills in a 24 hour period. 11/19/16   [name], MD   ibuprofen (ADVIL;MOTRIN) 600 MG tablet Take 1 tablet by mouth every 6 hours as needed for Pain 11/19/16   [name], MD         REVIEW OF SYSTEMS       Review of Systems   Constitutional:  Negative for appetite change, chills and fever.   Respiratory:  Negative for cough and shortness of breath.    Cardiovascular:  Negative for chest pain and leg swelling.   Gastrointestinal:  Negative for abdominal pain and diarrhea.   Genitourinary:  Negative for enuresis, frequency and urgency.   Musculoskeletal:  Positive for back pain.   Skin:  Negative for  rash.     PHYSICAL EXAM      INITIAL VITALS:   BP 118/75    Pulse 87    Temp 97.2 ??F (36.2 ??C) (Oral)    Resp 16    SpO2 98%     Physical Exam  Vitals reviewed.   Constitutional:       General: He is not in acute distress.  HENT:      Head: Normocephalic and atraumatic.      Ears:      Comments: Hearing grossly normal     Nose: Nose normal.      Mouth/Throat:      Mouth: Mucous membranes are moist.      Pharynx: Oropharynx is clear.   Eyes:      General: No scleral icterus.     Conjunctiva/sclera: Conjunctivae normal.      Pupils: Pupils are equal, round, and reactive to light.   Cardiovascular:      Rate and Rhythm: Normal rate and regular rhythm.      Pulses: Normal pulses.   Pulmonary:      Effort: Pulmonary effort is normal. No respiratory distress.      Breath sounds: Normal breath sounds.   Abdominal:      General: There is no distension.      Palpations: Abdomen is soft.      Tenderness: There is no abdominal tenderness. There is no guarding.   Musculoskeletal:      Cervical back: No muscular tenderness.      Right lower leg: No edema.      Left lower leg: No edema.      Comments: Lumbar paraspinal tenderness to palpation. No midline pain   Skin:     General: Skin is warm and dry.      Capillary Refill: Capillary refill takes less than 2 seconds.   Neurological:      General: No focal deficit present.      Mental Status: He is alert and oriented to person, place, and time. Mental status is at baseline.      Comments: 5/5  strength throughout lower extremities         DDX/DIAGNOSTIC RESULTS / EMERGENCY DEPARTMENT COURSE / MDM     Medical Decision Making  Amount and/or Complexity of Data Reviewed  Radiology: ordered.    Joe Campbell is a 46 y.o. man w/hx of low back pain from an injury several years ago. Currently asymptomatic. PCP wanted X-rays so patient reported to the ER. No concern for cauda equina, no weakness of extremities, bowel or bladder changes. Vitally stable and well  appearing.    EKG  none    All EKG's are interpreted by the Emergency Department Physician who either signs or Co-signs this chart in the absence of a cardiologist.    EMERGENCY DEPARTMENT COURSE:  Patient seen and evaluated, VSS and nontoxic in appearance.  X-rays were performed but patient eloped prior to results.         PROCEDURES:  none    CONSULTS:  None    CRITICAL CARE:  See attending note    FINAL IMPRESSION      1. Low back pain, unspecified back pain laterality, unspecified chronicity, unspecified whether sciatica present          DISPOSITION / PLAN     DISPOSITION Eloped - Left Before Treatment Complete 03/20/2021 02:30:37 PM      PATIENT REFERRED TO:  No follow-up provider specified.    DISCHARGE MEDICATIONS:  Discharge Medication List as of 03/20/2021  2:32 PM          Sharyn Lull, DO  Emergency Medicine Resident    (Please note that portions of thisnote were completed with a voice recognition program.  Efforts were made to edit the dictations but occasionally words are mis-transcribed.)      Sharyn Lull, DO  Resident  03/25/21 [phone]

## 2021-03-20 NOTE — Procedures (Signed)
[facility]                  [address] [place] [address]                              CARDIAC STRESS TEST    PATIENT NAME: Joe Campbell, FRITZE                  DOB:        08-07-1975  MED REC NO:   [id number]                             ROOM:  ACCOUNT NO:   [id number]                           ADMIT DATE: 03/20/2021  PROVIDER:     Osker Mason    DATE OF STUDY:  03/20/2021    ORDERING PROVIDER:  [name], CNP    INTERPRETING PHYSICIAN:  Osker Mason, MD    TREADMILL STRESS STUDY:    Procedure explained and consent was signed    Indication:  Chest pain    Resting heart rate:  88 bpm  Resting blood pressure:  110/84 mm/Hg  Resting EKG:  Abnormal (nonspecific change)  Maximum heart rate:  162 bpm, or 92% of age predicted maximum heart rate  Peak blood pressure:  150/65 mm/Hg  Duration:  9 minutes  METS:  10.1  Reason for termination:  Reached 92% of age predicted maximum heart rate  Stress heart response:  Normal  Stress blood pressure response:  Appropriate  Stress EKG's:  No changes seen  Chest discomfort:  None  Ischemic EKG changes:  None    IMPRESSION:  Electrocardiographically negative treadmill stress study.         Osker Mason    D: 03/21/2021 7:27:10       T: 03/21/2021 7:28:00     AK/BMOORE  Job#: [id number]     Doc#: Unknown    CC:    ()

## 2021-04-07 ENCOUNTER — Ambulatory Visit: Admit: 2021-04-07 | Discharge: 2021-04-07 | Payer: MEDICAID | Attending: Registered Nurse | Primary: Registered Nurse

## 2021-04-07 DIAGNOSIS — M545 Low back pain, unspecified: Secondary | ICD-10-CM

## 2021-04-07 DIAGNOSIS — Z1211 Encounter for screening for malignant neoplasm of colon: Secondary | ICD-10-CM

## 2021-04-07 MED ORDER — FLUCONAZOLE 150 MG PO TABS
150 MG | ORAL_TABLET | Freq: Once | ORAL | 1 refills | Status: AC
Start: 2021-04-07 — End: 2021-04-07

## 2021-04-07 MED ORDER — LIDOCAINE 5 % EX PTCH
5 % | MEDICATED_PATCH | Freq: Every day | CUTANEOUS | 1 refills | Status: AC
Start: 2021-04-07 — End: 2021-04-27

## 2021-04-07 NOTE — Progress Notes (Signed)
Joe Campbell (DOB:  1975-09-29) is a 46 y.o. male,Established patient, here for evaluation of the following chief complaint(s):  Establish Care (New patient 03/09/2021-was told to follow up in 4 weeks for est care), Rash (States on bilateral sides. States he is using the cream BID but it comes right back. States it may be ring worm), and Health Maintenance (Colonoscopy-cologuard)         ASSESSMENT/PLAN:  1. Chronic midline low back pain without sciatica  -     External Referral To Physical Therapy  -     lidocaine (LIDODERM) 5 %; Place 1 patch onto the skin daily for 20 days 12 hours on, 12 hours off., Disp-10 patch, R-1Normal  2. Tinea corporis  -     fluconazole (DIFLUCAN) 150 MG tablet; Take 1 tablet by mouth once for 1 dose, Disp-14 tablet, R-1Normal  3. Screen for colon cancer  -     Fecal DNA Colorectal cancer screening (Cologuard)    Back Pain: Rx for PT and lidocaine patch. Continue prn use of mobic for breakthrough pain.   Tinea corporis: Improving. Rx for diflucan with instruction for use. If no improvement to derm.   Colon cancer: Rx for cologuard - follow up pending results.     Return in about 6 months (around 10/08/2021) for 6 month follow up.         Subjective   SUBJECTIVE/OBJECTIVE:  Presents for 1 month follow up    Rash: Used ketoconazole cream like instructed after last OV  Noticed rash did lighten up quite a bit but still gets rings that can be itchy     Continues to have back pain  Went to ER for back xray, was confused because he wanted to do out patient images   Admits mobic does help with pain quite a bit, will be almost pain free for a few hours after taking  Lidocaine patch does also help with pain - will take back pain from 8/10 down to a 1/10  Denies sudden loss of bowel/bladder function  Denies saddle anesthesia    Denies any other problems/concerns at this time         Review of Systems   Musculoskeletal:  Positive for arthralgias, back pain and gait problem. Negative for  myalgias, neck pain and neck stiffness.   Skin:  Positive for rash.   Neurological:  Negative for weakness and numbness.        Objective   Physical Exam  Vitals and nursing note reviewed.   Constitutional:       Appearance: Normal appearance.   HENT:      Head: Normocephalic.      Right Ear: Hearing normal.      Left Ear: Hearing normal.      Nose: Nose normal.      Mouth/Throat:      Mouth: Mucous membranes are moist.      Pharynx: Oropharynx is clear.   Eyes:      Extraocular Movements: Extraocular movements intact.      Conjunctiva/sclera: Conjunctivae normal.      Pupils: Pupils are equal, round, and reactive to light.   Cardiovascular:      Rate and Rhythm: Normal rate and regular rhythm.      Pulses: Normal pulses.      Heart sounds: Normal heart sounds.   Pulmonary:      Effort: Pulmonary effort is normal.      Breath sounds: Normal breath sounds.   Abdominal:  General: Abdomen is flat. Bowel sounds are normal.      Palpations: Abdomen is soft.   Musculoskeletal:      Cervical back: Normal range of motion.      Lumbar back: Tenderness present. No bony tenderness. Decreased range of motion.   Skin:     General: Skin is warm and dry.      Capillary Refill: Capillary refill takes less than 2 seconds.      Findings: Rash present. No erythema. Rash is macular. Rash is not papular, urticarial or vesicular.      Comments: Light pink scaling circular rash with central clearing noted to trunk. Reports as slightly pruritic.    Neurological:      General: No focal deficit present.      Mental Status: He is alert and oriented to person, place, and time. Mental status is at baseline.   Psychiatric:         Mood and Affect: Mood normal.         Behavior: Behavior normal.         Thought Content: Thought content normal.         Judgment: Judgment normal.        Wt Readings from Last 3 Encounters:   04/07/21 184 lb (83.5 kg)   03/09/21 178 lb 3.2 oz (80.8 kg)   01/30/17 160 lb 0.9 oz (72.6 kg)     Temp Readings from  Last 3 Encounters:   03/20/21 97.2 ??F (36.2 ??C) (Oral)   02/24/21 98.7 ??F (37.1 ??C)   11/19/16 97.6 ??F (36.4 ??C)     BP Readings from Last 3 Encounters:   04/07/21 116/74   03/20/21 118/75   03/09/21 118/82     Pulse Readings from Last 3 Encounters:   04/07/21 97   03/20/21 87   03/09/21 93     An electronic signature was used to authenticate this note.    --[name], APRN - NP

## 2021-04-20 NOTE — Telephone Encounter (Signed)
Pt notified. Voiced understanding. Pt did not state he was going to pay out of pocket for testing.

## 2021-04-20 NOTE — Telephone Encounter (Signed)
-----   Message from Gaspar Cola sent at 04/20/2021 10:34 AM EDT -----  Subject: Message to Provider    QUESTIONS  Information for Provider? Insurance does not cover physical function test.   Pt wants to know if anything else can be ordered,   ---------------------------------------------------------------------------  --------------  Rod Can INFO  [phone]; OK to leave message on voicemail  ---------------------------------------------------------------------------  --------------  SCRIPT ANSWERS  Relationship to Patient? Self

## 2021-04-20 NOTE — Telephone Encounter (Signed)
Please advise & view ashleys encounter from today.

## 2021-04-20 NOTE — Telephone Encounter (Signed)
Received forms for pt in regards to [place]. States he needs a functional capacity test. Called pt and explained. Pt states he was going to call to see who excepts his insurance and he was going to call back so an order can be put in for FCT.   FORM is in blue folder in lab.

## 2021-04-20 NOTE — Telephone Encounter (Signed)
Unfortunately there is no other test for paperwork being requested by [employer]. He would need to likely pay out of pocket, otherwise form can not be completed.

## 2021-04-24 LAB — FECAL DNA COLORECTAL CANCER SCREENING (COLOGUARD): FIT-DNA (Cologuard): NEGATIVE

## 2021-06-09 ENCOUNTER — Ambulatory Visit: Payer: MEDICAID | Primary: Registered Nurse

## 2021-10-09 ENCOUNTER — Encounter: Payer: MEDICAID | Attending: Registered Nurse | Primary: Registered Nurse
